# Patient Record
Sex: Male | Born: 1955 | Race: White | Hispanic: No | Marital: Married
Health system: Southern US, Community
[De-identification: ages and names within clinical notes are randomized; demographics above are authoritative.]

---

## 2004-07-18 ENCOUNTER — Encounter: Admission: RE | Admit: 2004-07-18 | Discharge: 2004-07-18 | Payer: Self-pay | Admitting: Family Medicine

## 2004-07-27 ENCOUNTER — Encounter: Admission: RE | Admit: 2004-07-27 | Discharge: 2004-08-16 | Payer: Self-pay | Admitting: Family Medicine

## 2004-08-18 ENCOUNTER — Encounter: Admission: RE | Admit: 2004-08-18 | Discharge: 2004-08-18 | Payer: Self-pay | Admitting: Family Medicine

## 2012-04-01 ENCOUNTER — Ambulatory Visit: Payer: BC Managed Care – PPO | Admitting: Physical Therapy

## 2018-07-26 ENCOUNTER — Other Ambulatory Visit: Payer: Self-pay | Admitting: Orthopedic Surgery

## 2018-07-26 DIAGNOSIS — M25561 Pain in right knee: Secondary | ICD-10-CM

## 2018-07-26 DIAGNOSIS — T1590XA Foreign body on external eye, part unspecified, unspecified eye, initial encounter: Secondary | ICD-10-CM

## 2018-08-02 ENCOUNTER — Ambulatory Visit
Admission: RE | Admit: 2018-08-02 | Discharge: 2018-08-02 | Disposition: A | Discharge status: Home / Self Care | Payer: BLUE CROSS/BLUE SHIELD | Source: Ambulatory Visit | Attending: Orthopedic Surgery | Admitting: Orthopedic Surgery

## 2018-08-02 DIAGNOSIS — M25561 Pain in right knee: Secondary | ICD-10-CM

## 2018-08-02 DIAGNOSIS — T1590XA Foreign body on external eye, part unspecified, unspecified eye, initial encounter: Secondary | ICD-10-CM

## 2019-03-20 ENCOUNTER — Ambulatory Visit (INDEPENDENT_AMBULATORY_CARE_PROVIDER_SITE_OTHER): Payer: BC Managed Care – PPO | Admitting: Diagnostic Neuroimaging

## 2019-03-20 ENCOUNTER — Encounter: Payer: BC Managed Care – PPO | Admitting: Diagnostic Neuroimaging

## 2019-03-20 ENCOUNTER — Other Ambulatory Visit: Payer: Self-pay

## 2019-03-20 DIAGNOSIS — R202 Paresthesia of skin: Secondary | ICD-10-CM | POA: Diagnosis not present

## 2019-03-20 DIAGNOSIS — Z0289 Encounter for other administrative examinations: Secondary | ICD-10-CM

## 2019-03-31 NOTE — Procedures (Signed)
   [facility]  NCS (NERVE CONDUCTION STUDY) WITH EMG (ELECTROMYOGRAPHY) REPORT   STUDY DATE: 03/20/19 PATIENT NAME: Terrence Hall DOB: 31-Jan-1956 MRN: [id number]  ORDERING CLINICIAN: Percell Belt, PA  TECHNOLOGIST: Sherre Scarlet ELECTROMYOGRAPHER: Earlean Polka. Anasofia Micallef, MD  CLINICAL INFORMATION: 63 year old male with right foot numbness.  FINDINGS: NERVE CONDUCTION STUDY:  Right peroneal and right tibial motor responses are normal.  Right tibial F wave latency is normal.  Right sural and superficial peroneal sensory responses are normal.   NEEDLE ELECTROMYOGRAPHY:  Patient declined needle EMG testing.   IMPRESSION:   Normal study.  No electrodiagnostic evidence of large fiber neuropathy.    INTERPRETING PHYSICIAN:  Penni Bombard, MD Certified in Neurology, Neurophysiology and Neuroimaging  [facility] [address], [place], [place] [address] [phone]  [facility]    Nerve / Sites Muscle Latency Ref. Amplitude Ref. Rel Amp Segments Distance Velocity Ref. Area    ms ms mV mV %  cm m/s m/s mVms  R Peroneal - EDB     Ankle EDB 4.7 ?6.5 4.3 ?2.0 100 Ankle - EDB 9   12.8     Fib head EDB 11.6  3.3  76.6 Fib head - Ankle 30 44 ?44 13.7     Pop fossa EDB 13.9  3.0  92.8 Pop fossa - Fib head 10 44 ?44 13.0         Pop fossa - Ankle      R Tibial - AH     Ankle AH 4.5 ?5.8 5.8 ?4.0 100 Ankle - AH 9   13.3     Pop fossa AH 14.4  4.5  77.5 Pop fossa - Ankle 40 41 ?41 9.6         SNC    Nerve / Sites Rec. Site Peak Lat Ref.  Amp Ref. Segments Distance    ms ms V V  cm  R Sural - Ankle (Calf)     Calf Ankle 3.0 ?4.4 7 ?6 Calf - Ankle 14  R Superficial peroneal - Ankle     Lat leg Ankle 4.0 ?4.4 6 ?6 Lat leg - Ankle 14         F  Wave    Nerve F Lat Ref.   ms ms  R Tibial - AH 51.5 ?56.0

## 2019-07-11 DEATH — deceased

## 2020-03-23 IMAGING — MR MR KNEE*R* W/O CM
4 of 6 series · 19 of 40 positions shown · non-contrast
Comparison: Plain films right knee 01/12/2016.

CLINICAL DATA: Right knee pain, swelling and popping for several
weeks. No known injury.

EXAM:
MRI OF THE RIGHT KNEE WITHOUT CONTRAST
TECHNIQUE: Multiplanar, multisequence MR imaging of the knee was performed. No
intravenous contrast was administered.

[Series 5: T2 fat-sat · axial · 4.0mm · 0.31mm/px · z∈[-96,-8]mm · 3 of 30 slices shown (1 of 2)]
[im 5/30]
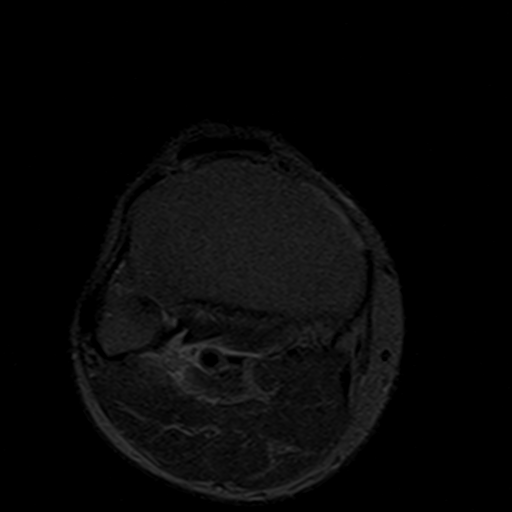
[im 15/30]
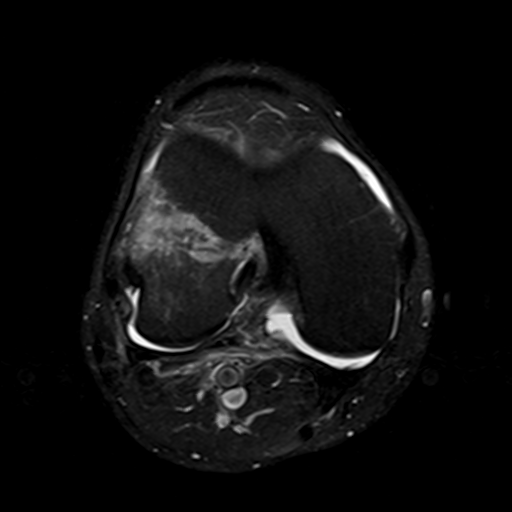
[im 25/30]
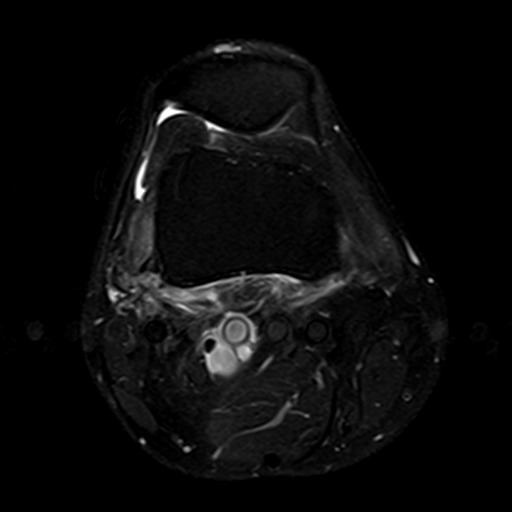

[Series 6: T2 fat-sat · sagittal · 3.0mm · 0.29mm/px · 3 of 27 slices shown (2 of 2)]
[im 5/27]
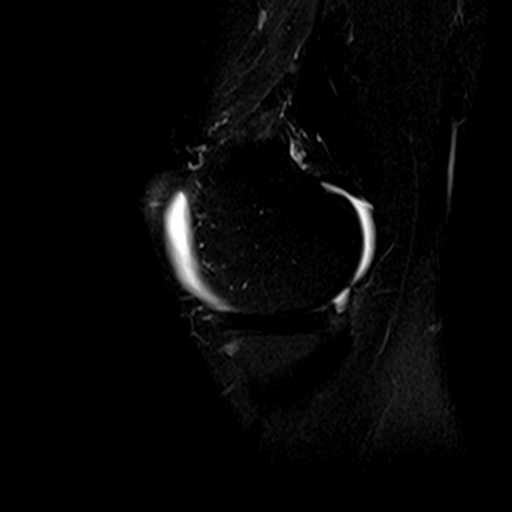
[im 14/27]
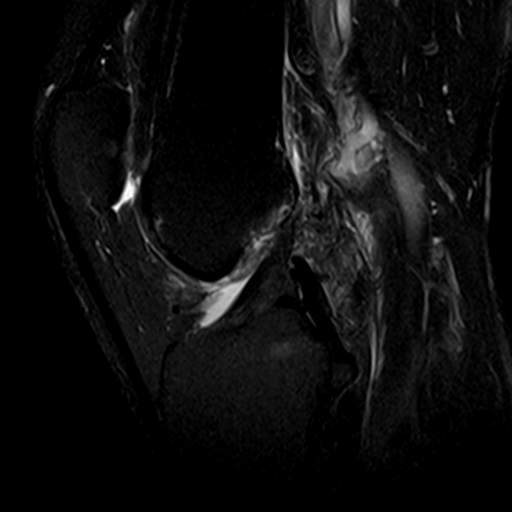
[im 22/27]
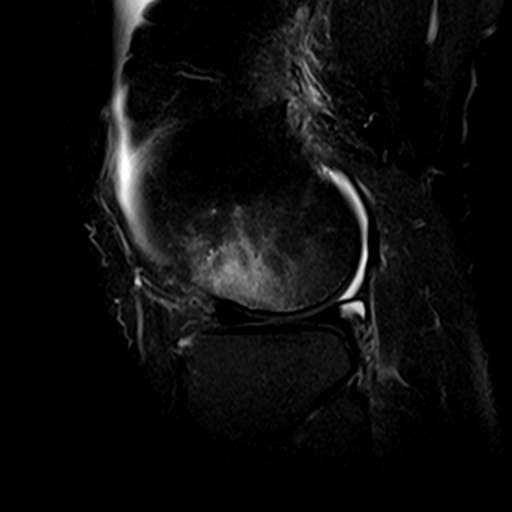

[Series 8: PD fat-sat · sagittal · 3.0mm · 0.29mm/px · 7 of 28 slices shown (1 of 2)]
[im 1/28]
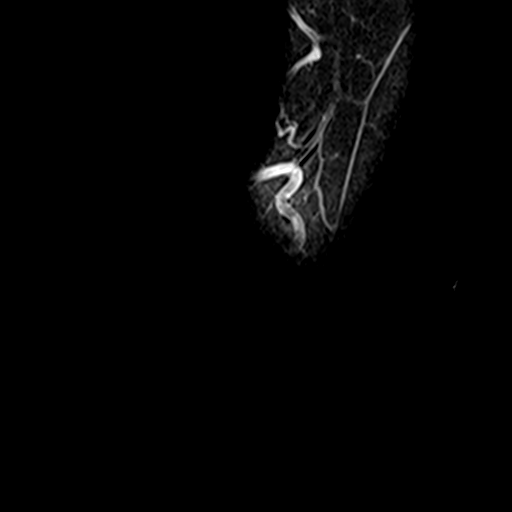
[im 5/28]
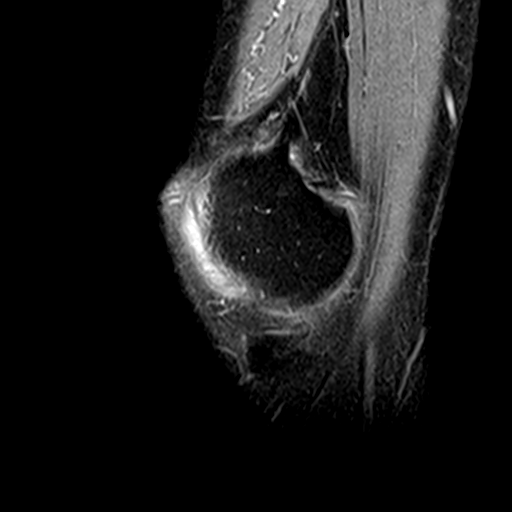
[im 10/28]
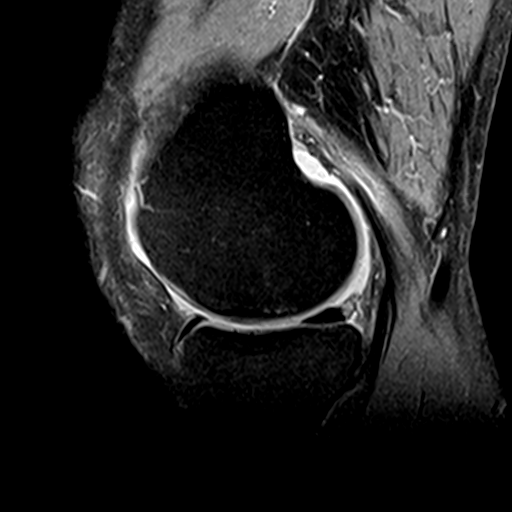
[im 14/28]
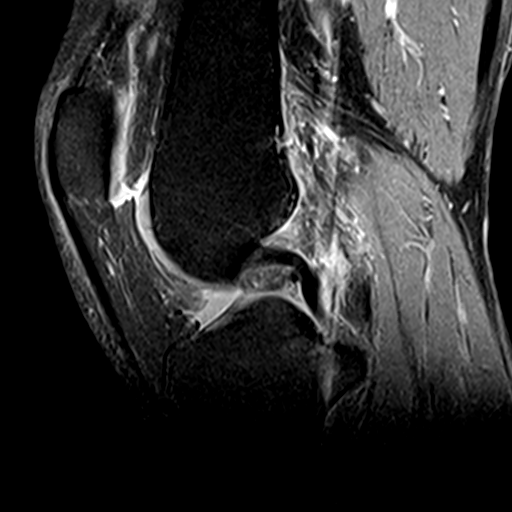
[im 19/28]
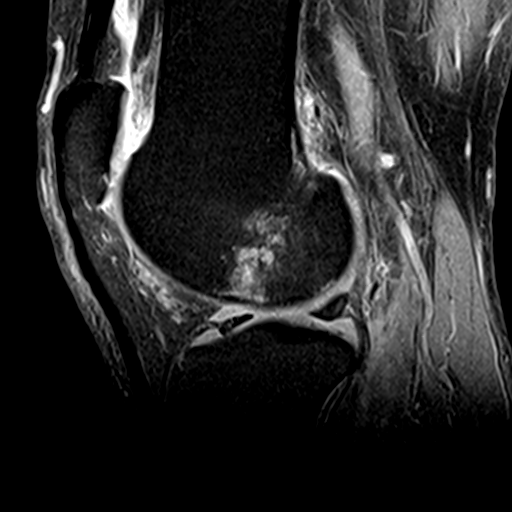
[im 23/28]
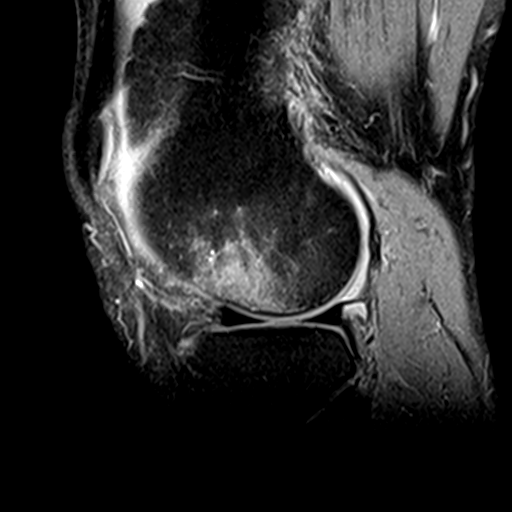
[im 28/28]
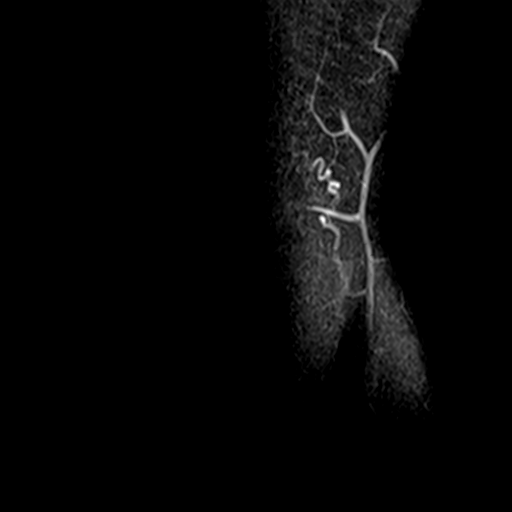

[Series 11: PD fat-sat · coronal · 3.0mm · 0.29mm/px · 6 of 29 slices shown (2 of 2)]
[im 1/29]
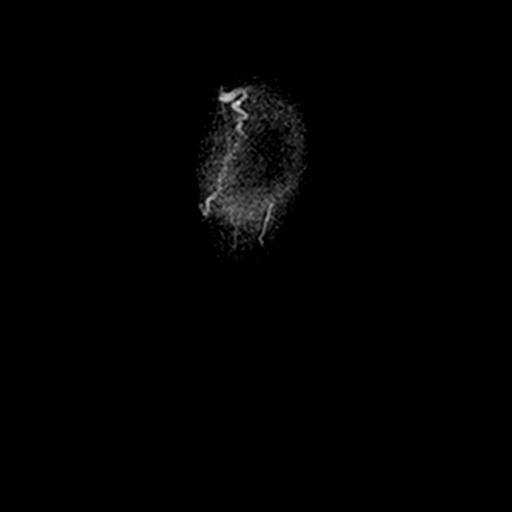
[im 5/29]
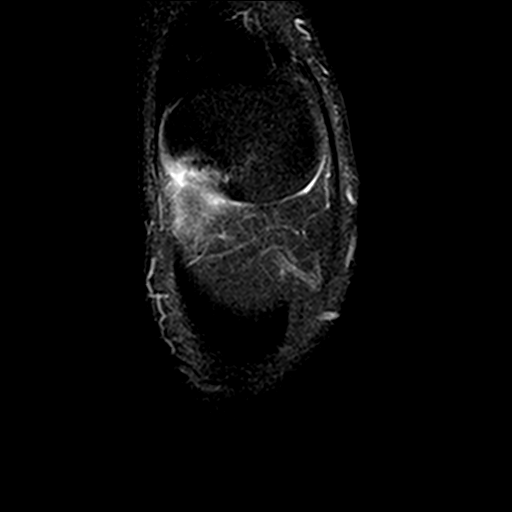
[im 10/29]
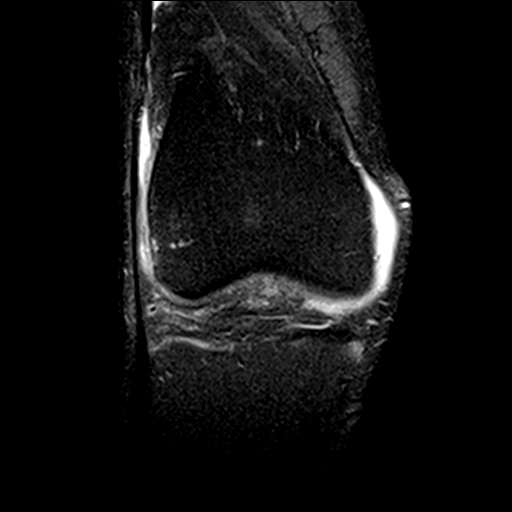
[im 15/29]
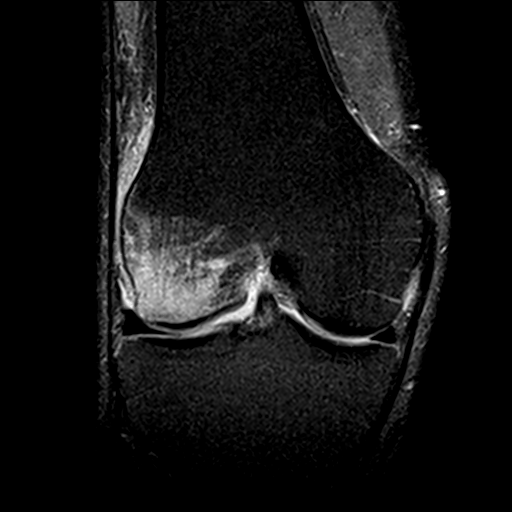
[im 19/29]
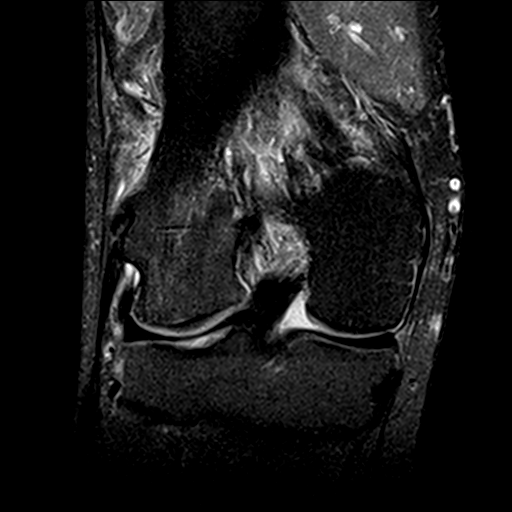
[im 24/29]
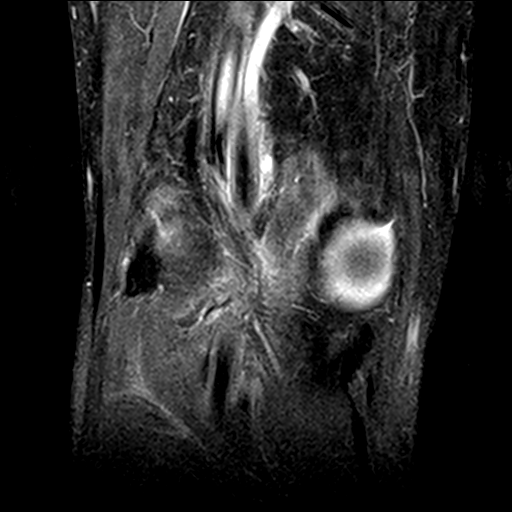

[19 of 40 positions shown; findings below may reference images not displayed]

FINDINGS: MENISCI

Medial meniscus:  Intact.

Lateral meniscus:  Intact.

LIGAMENTS

Cruciates:  Intact.

Collaterals:  Intact.

CARTILAGE

Patellofemoral: Cartilage loss is worst along the lateral patellar
facet and lateral femoral trochlea.

Medial:  Preserved.

Lateral:  Minimally degenerated.

Joint:  Small effusion.

Popliteal Fossa:  No Baker's cyst.

Extensor Mechanism:  Intact.

Bones: Marrow edema is present in the weight-bearing lateral femoral
condyle and most intense in subchondral bone consistent with stress
change. No fracture is identified.

Other: None.
IMPRESSION: Intense marrow edema in the weight-bearing lateral femoral condyle
consistent with stress change. No fracture is identified.

Negative for meniscal or ligament tear.

Moderate patellofemoral and mild medial compartment osteoarthritis.

## 2024-04-04 ENCOUNTER — Other Ambulatory Visit: Payer: Self-pay | Admitting: Urology

## 2024-04-04 DIAGNOSIS — R972 Elevated prostate specific antigen [PSA]: Secondary | ICD-10-CM

## 2024-04-07 ENCOUNTER — Encounter: Payer: Self-pay | Admitting: Urology

## 2024-05-23 ENCOUNTER — Other Ambulatory Visit
